# Patient Record
Sex: Female | Born: 1980
Health system: Southern US, Community
[De-identification: ages and names within clinical notes are randomized; demographics above are authoritative.]

## PROBLEM LIST (undated history)

## (undated) DIAGNOSIS — L0591 Pilonidal cyst without abscess: Secondary | ICD-10-CM

## (undated) DIAGNOSIS — R002 Palpitations: Secondary | ICD-10-CM

## (undated) DIAGNOSIS — R635 Abnormal weight gain: Secondary | ICD-10-CM

## (undated) DIAGNOSIS — R079 Chest pain, unspecified: Secondary | ICD-10-CM

## (undated) DIAGNOSIS — F419 Anxiety disorder, unspecified: Secondary | ICD-10-CM

## (undated) HISTORY — DX: Abnormal weight gain: R63.5

## (undated) HISTORY — DX: Anxiety disorder, unspecified: F41.9

## (undated) HISTORY — DX: Pilonidal cyst without abscess: L05.91

## (undated) HISTORY — PX: OTHER SURGICAL HISTORY: SHX169

## (undated) HISTORY — DX: Chest pain, unspecified: R07.9

## (undated) HISTORY — PX: WISDOM TOOTH EXTRACTION: SHX21

## (undated) HISTORY — DX: Palpitations: R00.2

---

## 2014-01-21 ENCOUNTER — Other Ambulatory Visit (HOSPITAL_COMMUNITY)
Admission: RE | Admit: 2014-01-21 | Discharge: 2014-01-21 | Disposition: A | Discharge status: Home / Self Care | Payer: BC Managed Care – PPO | Source: Ambulatory Visit | Attending: Family Medicine | Admitting: Family Medicine

## 2014-01-21 DIAGNOSIS — Z0000 Encounter for general adult medical examination without abnormal findings: Secondary | ICD-10-CM | POA: Insufficient documentation

## 2014-02-09 ENCOUNTER — Encounter: Payer: Self-pay | Admitting: *Deleted

## 2014-02-09 ENCOUNTER — Encounter: Payer: Self-pay | Admitting: Cardiology

## 2014-02-09 DIAGNOSIS — F419 Anxiety disorder, unspecified: Secondary | ICD-10-CM | POA: Insufficient documentation

## 2014-02-09 DIAGNOSIS — L0591 Pilonidal cyst without abscess: Secondary | ICD-10-CM | POA: Insufficient documentation

## 2014-02-09 DIAGNOSIS — R635 Abnormal weight gain: Secondary | ICD-10-CM | POA: Insufficient documentation

## 2014-02-09 DIAGNOSIS — R079 Chest pain, unspecified: Secondary | ICD-10-CM | POA: Insufficient documentation

## 2014-02-10 ENCOUNTER — Encounter: Payer: Self-pay | Admitting: Cardiovascular Disease

## 2014-02-10 ENCOUNTER — Ambulatory Visit (INDEPENDENT_AMBULATORY_CARE_PROVIDER_SITE_OTHER): Payer: BC Managed Care – PPO | Admitting: Cardiovascular Disease

## 2014-02-10 VITALS — BP 130/80 | HR 63 | Ht 70.0 in | Wt 274.0 lb

## 2014-02-10 DIAGNOSIS — F411 Generalized anxiety disorder: Secondary | ICD-10-CM

## 2014-02-10 DIAGNOSIS — R079 Chest pain, unspecified: Secondary | ICD-10-CM

## 2014-02-10 DIAGNOSIS — F17200 Nicotine dependence, unspecified, uncomplicated: Secondary | ICD-10-CM | POA: Insufficient documentation

## 2014-02-10 NOTE — Addendum Note (Signed)
Addended by: Scherrie BatemanYORK, Javari Bufkin E on: 02/10/2014 10:00 AM   Modules accepted: Orders

## 2014-02-10 NOTE — Assessment & Plan Note (Signed)
Consider changing celexa to welbutrin and PRN xanax to aid in smoking cessation

## 2014-02-10 NOTE — Assessment & Plan Note (Signed)
Ready to quit: No Counseling given: Yes CXR today

## 2014-02-10 NOTE — Progress Notes (Signed)
Patient ID: Tina SchilderKatie [name], female   DOB: 06/13/1981, 33 y.o.   MRN: [id number]   33 yo referred by primary [facility] for chest pain  Smoker for about 10 years  5 cigs/day.  No CXR ever done.  Panic attacks since young  SSCP sharp radiating to back usually in association with anxiety.  Has PRN xanax that helps.  Only one/two episodes/year now.  Sometimes gets pain when exercising but thinks this is muscular from overdoing it.  No pleuritic Component.  No cough sputum or fever  On SSRI for anxiety has never had cardiac w/u.  SSCP for years worse at beginning of this year.  History of mild dependant edema  Was on fluid pill at one time     ROS: Denies fever, malais, weight loss, blurry vision, decreased visual acuity, cough, sputum, SOB, hemoptysis, pleuritic pain, palpitaitons, heartburn, abdominal pain, melena, lower extremity edema, claudication, or rash.  All other systems reviewed and negative   General: Affect appropriate Healthy:  appears stated age HEENT: normal Neck supple with no adenopathy JVP normal no bruits no thyromegaly Lungs clear with no wheezing and good diaphragmatic motion Heart:  S1/S2 no murmur,rub, gallop or click PMI normal Abdomen: benighn, BS positve, no tenderness, no AAA no bruit.  No HSM or HJR Distal pulses intact with no bruits No edema Neuro non-focal Skin warm and dry No muscular weakness  Medications Current Outpatient Prescriptions  Medication Sig Dispense Refill  . citalopram (CELEXA) 20 MG tablet Take 1 tablet by mouth daily.      Marland Kitchen. LORazepam (ATIVAN) 1 MG tablet Take 1 tablet by mouth daily.      . Multiple Vitamin (MULTIVITAMIN) capsule Take 1 capsule by mouth daily.       No current facility-administered medications for this visit.    Allergies Review of patient's allergies indicates no known allergies.  Family History: No family history on file.  Social History: History   Social History  . Marital Status: N/A    Spouse Name: N/A      Number of Children: N/A  . Years of Education: N/A   Occupational History  . Not on file.   Social History Main Topics  . Smoking status: Current Every Day Smoker  . Smokeless tobacco: Not on file  . Alcohol Use: Yes  . Drug Use: Yes  . Sexual Activity: Not on file   Other Topics Concern  . Not on file   Social History Narrative  . No narrative on file    Electrocardiogram:  NSR rate 63 normal   Assessment and Plan

## 2014-02-10 NOTE — Patient Instructions (Signed)
Your physician recommends that you schedule a follow-up appointment in: AS NEEDED  Your physician recommends that you continue on your current medications as directed. Please refer to the Current Medication list given to you today. A chest x-ray takes a picture of the organs and structures inside the chest, including the heart, lungs, and blood vessels. This test can show several things, including, whether the heart is enlarges; whether fluid is building up in the lungs; and whether pacemaker / defibrillator leads are still in place. Your physician has requested that you have an exercise tolerance test. For further information please visit [email or web address]. Please also follow instruction sheet, as given.  

## 2014-02-10 NOTE — Assessment & Plan Note (Signed)
Atypical Normal ECG F/U ETT

## 2014-02-19 ENCOUNTER — Ambulatory Visit (HOSPITAL_COMMUNITY)
Admission: RE | Admit: 2014-02-19 | Discharge: 2014-02-19 | Disposition: A | Discharge status: Home / Self Care | Payer: BC Managed Care – PPO | Source: Ambulatory Visit | Attending: Internal Medicine | Admitting: Internal Medicine

## 2014-02-19 ENCOUNTER — Encounter: Payer: Self-pay | Admitting: Cardiovascular Disease

## 2014-02-19 DIAGNOSIS — R079 Chest pain, unspecified: Secondary | ICD-10-CM

## 2014-02-22 ENCOUNTER — Telehealth: Payer: Self-pay | Admitting: *Deleted

## 2014-02-22 NOTE — Telephone Encounter (Signed)
[name], [name] ','<More Detail >>       [name], MD       Sent: Mon February 22, 2014 9:44 AM    To: Alois Clichehristine E [name], LPN                   Message     Normal stress test        ----- Message -----    From: Mickel Duhamelobin C. Moffitt    Sent: 02/22/2014 8:51 AM    To: [name], MD                                  Results Exercise Tolerance Test (Order [id number])      Exercise Tolerance Test   Status: Final result Visible to patient: This result is not viewable by the patient. Next appt: None         Specimen Collected: 02/19/14 10:15 AM Last Resulted: 02/19/14 3:39 PM          Linked Documents     View Image               Result Information     Status     Final result (02/19/2014 3:39 PM)     Provider Status: Ordered    Encounter      View Encounter                Lab Information     M[facility]               Order-Level Documents:     There are no order-level documents.         Exercise Tolerance Test (Order [id number]) Released By: Auto-released Date: 02/19/2014  ECG Authorizing: Provider Default, MD Department: [facility] [address] [address]  Order: [id number]         Provider Default, MD  NPI:         Order Information     Order Date/Time Release Date/Time Start Date/Time End Date/Time    02/19/14 10:15 AM 02/19/14 10:15 AM 02/19/14 10:15 AM 02/19/14 10:15 AM           Order Details     Frequency Duration Priority Order Class    Once 1 occurrence Routine Normal           Comments     Ordered by Charlton HawsPETER NISHAN           Order History Inpatient     Date/Time Action Taken User Additional Information    02/19/14 1015 Release  From Order: [id number]    02/19/14 1539 Result Lab In [facility] Interface Final           Collection Information     Collected: 02/19/2014 10:15 AM Resulting Agency: [facility]          Patient Information     Patient Name  Sex DOB SSN    [name], Tina Female 01/18/1981 [id number]           Additional Information     Associated Reports    View Parent Encounter    Priority and Order Details    Collection Information.           Order Building surveyorrovider Info       Office phone Pager/beeper E-mail    Ordering User Lab In [facility] Interface -- -- --  Authorizing Provider Provider Default, MD [phone] -- --            Encounter     View Encounter            Order-Level Documents:     There are no order-level documents.         LMTCB .Zack Seal

## 2014-02-24 NOTE — Telephone Encounter (Signed)
PT AWARE OF STRESS TEST RESULTS./CY 

## 2014-12-16 ENCOUNTER — Other Ambulatory Visit: Payer: Self-pay | Admitting: Family Medicine

## 2014-12-16 ENCOUNTER — Ambulatory Visit
Admission: RE | Admit: 2014-12-16 | Discharge: 2014-12-16 | Disposition: A | Discharge status: Home / Self Care | Payer: BC Managed Care – PPO | Source: Ambulatory Visit | Attending: Family Medicine | Admitting: Family Medicine

## 2014-12-16 DIAGNOSIS — M25552 Pain in left hip: Secondary | ICD-10-CM

## 2015-06-20 ENCOUNTER — Other Ambulatory Visit: Payer: Self-pay | Admitting: Family Medicine

## 2015-06-20 ENCOUNTER — Other Ambulatory Visit (HOSPITAL_COMMUNITY)
Admission: RE | Admit: 2015-06-20 | Discharge: 2015-06-20 | Disposition: A | Discharge status: Home / Self Care | Payer: BLUE CROSS/BLUE SHIELD | Source: Ambulatory Visit | Attending: Family Medicine | Admitting: Family Medicine

## 2015-06-20 DIAGNOSIS — Z01419 Encounter for gynecological examination (general) (routine) without abnormal findings: Secondary | ICD-10-CM | POA: Insufficient documentation

## 2015-06-22 LAB — CYTOLOGY - PAP

## 2015-08-03 ENCOUNTER — Other Ambulatory Visit: Payer: Self-pay | Admitting: Surgery

## 2015-08-03 NOTE — H&P (Signed)
Tina [name] 08/03/2015 9:17 AM Location: [facility] Patient #: [id number] DOB: 26-May-1981 Single / Language: [place] / Race: White Female History of Present Illness Ardeth Sportsman MD; 08/03/2015 12:32 PM) Patient words: anal fissure.  The patient is a 34 year old female who presents with anal fistula. Patient sent by primary care physician Dr. Fulton Mole for consultation concerning perianal drainage and probable perirectal fistula Pleasant morbidly obese female. History of pilonidal abscesses that required incision and drainage at least 3 times. Twice in college. Usually every month she'll get some swelling and mild drainage but nothing severe. Does not feel like the areas ever fully closed. The main reason she is here because she had some sharp perirectal pain and swelling. Noticed a lump. Wondered was a hemorrhoid. Tried creams and wipes. It became or fluctuance. Then started to drain. Her partner help express to drainage. Suspicion for anal fistula on examination by her primary care physician. Surgical consultation requested. She does smoke a few cigarettes a day. Usually has to bowel was a day. Denies any severe episodes of constipation or diarrhea. Denies any inflammatory bowel disease, irritable bowel syndrome, food or dietary allergies. We will continue miles without difficulty. 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 No severe distress. No hematochezia or melena. She's never had prior anorectal interventions. No history of fall or trauma. She does note that she tends to get lumps insists on her body. HAd one in her groin. No definite boils/abscesses aside from the pilonidal and perirectal areas. Other Problems Lamar Laundry Bynum, CMA; 08/03/2015 9:17 AM) Anxiety Disorder Gastroesophageal Reflux Disease High blood pressure  Past Surgical History Gilmer Mor, CMA; 08/03/2015 9:17 AM) Tonsillectomy  Diagnostic  Studies History Gilmer Mor, CMA; 08/03/2015 9:17 AM) Colonoscopy never Mammogram never Pap Smear 1-5 years ago  Allergies Lamar Laundry Bynum, CMA; 08/03/2015 9:18 AM) No Known Drug Allergies 08/03/2015  Medication History (Sonya Bynum, CMA; 08/03/2015 9:19 AM) LORazepam (1MG  Tablet, Oral) Active. Citalopram Hydrobromide (40MG  Tablet, Oral) Active. Multivitamin Adult (Oral) Active. Medications Reconciled  Social History Gilmer Mor, CMA; 08/03/2015 9:17 AM) Alcohol use Moderate alcohol use. Caffeine use Carbonated beverages, Coffee. No drug use Tobacco use Current some day smoker.  Family History Gilmer Mor, CMA; 08/03/2015 9:17 AM) Breast Cancer Family Members In General. Cancer Family Members In General. Cerebrovascular Accident Family Members In General. Depression Father. Hypertension Family Members In General, Father.  Pregnancy / Birth History Gilmer Mor, CMA; 08/03/2015 9:17 AM) Age at menarche 13 years. Gravida 0 Para 0 Regular periods     Review of Systems Lamar Laundry Bynum CMA; 08/03/2015 9:17 AM) General Not Present- Appetite Loss, Chills, Fatigue, Fever, Night Sweats, Weight Gain and Weight Loss. Skin Not Present- Change in Wart/Mole, Dryness, Hives, Jaundice, New Lesions, Non-Healing Wounds, Rash and Ulcer. HEENT Not Present- Earache, Hearing Loss, Hoarseness, Nose Bleed, Oral Ulcers, Ringing in the Ears, Seasonal Allergies, Sinus Pain, Sore Throat, Visual Disturbances, Wears glasses/contact lenses and Yellow Eyes. Respiratory Present- Snoring. Not Present- Bloody sputum, Chronic Cough, Difficulty Breathing and Wheezing. Breast Not Present- Breast Mass, Breast Pain, Nipple Discharge and Skin Changes. Cardiovascular Not Present- Chest Pain, Difficulty Breathing Lying Down, Leg Cramps, Palpitations, Rapid Heart Rate, Shortness of Breath and Swelling of Extremities. Gastrointestinal Present- Rectal Pain. Not Present- Abdominal Pain, Bloating, Bloody Stool,  Change in Bowel Habits, Chronic diarrhea, Constipation, Difficulty Swallowing, Excessive gas, Gets full quickly at meals, Hemorrhoids, Indigestion, Nausea and Vomiting. Female Genitourinary Not Present- Frequency, Nocturia, Painful  Urination, Pelvic Pain and Urgency. Musculoskeletal Not Present- Back Pain, Joint Pain, Joint Stiffness, Muscle Pain, Muscle Weakness and Swelling of Extremities. Neurological Not Present- Decreased Memory, Fainting, Headaches, Numbness, Seizures, Tingling, Tremor, Trouble walking and Weakness. Psychiatric Present- Anxiety. Not Present- Bipolar, Change in Sleep Pattern, Depression, Fearful and Frequent crying. Endocrine Not Present- Cold Intolerance, Excessive Hunger, Hair Changes, Heat Intolerance, Hot flashes and New Diabetes. Hematology Not Present- Easy Bruising, Excessive bleeding, Gland problems, HIV and Persistent Infections.  Vitals (Sonya Bynum CMA; 08/03/2015 9:18 AM) 08/03/2015 9:17 AM Weight: 277.2 lb Height: 70in Body Surface Area: 2.49 m Body Mass Index: 39.77 kg/m Temp.: 98.38F(Temporal)  Pulse: 76 (Regular)  BP: 128/80 (Sitting, Left Arm, Standard)     Physical Exam Ardeth Sportsman MD; 08/03/2015 10:09 AM)  General Mental Status-Alert. General Appearance-Not in acute distress, Not Sickly. Orientation-Oriented X3. Hydration-Well hydrated. Voice-Normal.  Integumentary Global Assessment Upon inspection and palpation of skin surfaces of the - Axillae: non-tender, no inflammation or ulceration, no drainage. and Distribution of scalp and body hair is normal. General Characteristics Temperature - normal warmth is noted.  Head and Neck Head-normocephalic, atraumatic with no lesions or palpable masses. Face Global Assessment - atraumatic, no absence of expression. Neck Global Assessment - no abnormal movements, no bruit auscultated on the right, no bruit auscultated on the left, no decreased range of motion,  non-tender. Trachea-midline. Thyroid Gland Characteristics - non-tender.  Eye Eyeball - Left-Extraocular movements intact, No Nystagmus. Eyeball - Right-Extraocular movements intact, No Nystagmus. Cornea - Left-No Hazy. Cornea - Right-No Hazy. Sclera/Conjunctiva - Left-No scleral icterus, No Discharge. Sclera/Conjunctiva - Right-No scleral icterus, No Discharge. Pupil - Left-Direct reaction to light normal. Pupil - Right-Direct reaction to light normal.  ENMT Ears Pinna - Left - no drainage observed, no generalized tenderness observed. Right - no drainage observed, no generalized tenderness observed. Nose and Sinuses External Inspection of the Nose - no destructive lesion observed. Inspection of the nares - Left - quiet respiration. Right - quiet respiration. Mouth and Throat Lips - Upper Lip - no fissures observed, no pallor noted. Lower Lip - no fissures observed, no pallor noted. Nasopharynx - no discharge present. Oral Cavity/Oropharynx - Tongue - no dryness observed. Oral Mucosa - no cyanosis observed. Hypopharynx - no evidence of airway distress observed.  Chest and Lung Exam Inspection Movements - Normal and Symmetrical. Accessory muscles - No use of accessory muscles in breathing. Palpation Palpation of the chest reveals - Non-tender. Auscultation Breath sounds - Normal and Clear.  Cardiovascular Auscultation Rhythm - Regular. Murmurs & Other Heart Sounds - Auscultation of the heart reveals - No Murmurs and No Systolic Clicks.  Abdomen Inspection Inspection of the abdomen reveals - No Visible peristalsis and No Abnormal pulsations. Umbilicus - No Bleeding, No Urine drainage. Palpation/Percussion Palpation and Percussion of the abdomen reveal - Soft, Non Tender, No Rebound tenderness, No Rigidity (guarding) and No Cutaneous hyperesthesia. Note: Obese but soft. No diastases. No umbilical hernia.   Female Genitourinary Sexual Maturity Tanner 5 -  Adult hair pattern. Note: No vaginal bleeding nor discharge. Rectovaginal septum normal size without any fluctuance or mass.   Rectal Note: Perianal skin clear. No sinus. No abscess. Normal sphincter tone. No fistula. No external hemorrhoids. No fissure. Tolerates digital and anoscopic examination. Small dot of yellow drainage in LEFT anterior anal crypt but again no reproduction of drainage with perianal or vaginal pressure.  Scar RIGHT paramedian upper intergluteal cleft consistent with prior pilonidal incision and drainage. 2 pits. Midline. No pus  expressed today. Mild tenderness   Peripheral Vascular Upper Extremity Inspection - Left - No Cyanotic nailbeds, Not Ischemic. Right - No Cyanotic nailbeds, Not Ischemic.  Neurologic Neurologic evaluation reveals -normal attention span and ability to concentrate, able to name objects and repeat phrases. Appropriate fund of knowledge , normal sensation and normal coordination. Mental Status Affect - not angry, not paranoid. Cranial Nerves-Normal Bilaterally. Gait-Normal.  Neuropsychiatric Mental status exam performed with findings of-able to articulate well with normal speech/language, rate, volume and coherence, thought content normal with ability to perform basic computations and apply abstract reasoning and no evidence of hallucinations, delusions, obsessions or homicidal/suicidal ideation.  Musculoskeletal Global Assessment Spine, Ribs and Pelvis - no instability, subluxation or laxity. Right Upper Extremity - no instability, subluxation or laxity.  Lymphatic Head & Neck  General Head & Neck Lymphatics: Bilateral - Description - No Localized lymphadenopathy. Axillary  General Axillary Region: Bilateral - Description - No Localized lymphadenopathy. Femoral & Inguinal  Generalized Femoral & Inguinal Lymphatics: Left - Description - No Localized lymphadenopathy. Right - Description - No Localized  lymphadenopathy.    Assessment & Plan Ardeth Sportsman MD; 08/03/2015 12:33 PM)  PILONIDAL DISEASE (709.8  L98.8) Impression: Persistent scar with intermittent inflammation and drainage on a monthly basis.  Think she would benefit from removal of the pilonidal disease since it is a recurrent problem. Interesting seems to be related to her menstrual periods - ?hormonally triggered.  Because it been a problem for years, she wishes to try and coordinate a time that is more convenient from a work standpoint.  I strongly recommend she quit smoking. She agrees that would help break the cycle of infections.  Current Plans Schedule for Surgery Written instructions provided The anatomy of the intragluteal cleft was discussed. Pathophysiology of pilonidal disease was discussed. The importance of keeping hairs trimmed to avoid recurrence was discussed. Discussion of options such as curretage, excision with closure vs leaving open was discussed. Risks of infection with need for incision and drainage & antibiotics were discussed. I noted a good likelihood this will help address the problem.  At this point, I think the patient would best served with considering surgery to excise the diseased tissue. I will make an attempt to close but it may need to be left open to allow it to heal with secondary intention and wound packing. Possible recurrences need reoperation or different techniques were discussed as well. I noted that recurrence is higher with poor compliance on hair removal hygiene & overall health. The patient's questions were answered. The patient agrees to proceed. Pt Education - [facility] Pilonidal Disease (AT) Pt Education - [facility] Good Bowel Health (Xane Ams[place]) STOP SMOKING!  We strongly recommend that you stop smoking. Smoking increases the risk of surgery including infection in the form of an open wound, pus formation, abscess, hernia at an incision on the abdomen, etc. You have an increased risk of other  MAJOR complications such as stroke, heart attack, forming clots in the leg and/or lungs, and death.  Smoking Cessation Quitting smoking is important to your health and has many advantages. However, it is not always easy to quit since nicotine is a very addictive drug. Often times, people try 3 times or more before being able to quit. This document explains the best ways for you to prepare to quit smoking. Quitting takes hard work and a lot of effort, but you can do it. ADVANTAGES OF QUITTING SMOKING  You will live longer, feel better, and live better.  Your body will feel the impact of quitting smoking almost immediately.  Within 20 minutes, blood pressure decreases. Your pulse returns to its normal level.  After 8 hours, carbon monoxide levels in the blood return to normal. Your oxygen level increases.  After 24 hours, the chance of having a heart attack starts to decrease. Your breath, hair, and body stop smelling like smoke.  After 48 hours, damaged nerve endings begin to recover. Your sense of taste and smell improve.  After 72 hours, the body is virtually free of nicotine. Your bronchial tubes relax and breathing becomes easier.  After 2 to 12 weeks, lungs can hold more air. Exercise becomes easier and circulation improves.  The risk of having a heart attack, stroke, cancer, or lung disease is greatly reduced.  After 1 year, the risk of coronary heart disease is cut in half.  After 5 years, the risk of stroke falls to the same as a nonsmoker.  After 10 years, the risk of lung cancer is cut in half and the risk of other cancers decreases significantly.  After 15 years, the risk of coronary heart disease drops, usually to the level of a nonsmoker.  If you are pregnant, quitting s PERI-RECTAL ABSCESS (566  K61.1) Impression: History of prior exam suspicious for perirectal abscess.  There is no evidence of any external coronary persistent abscess at this time. I believe it is  healed. No strong evidence of fistula at this time.  Small dot of drainage and an anal crypt but again no drainage externally. I think she is healed on her own.  Should she get another infection, would treat with incision and drainage and antibiotics. If has persistent drainage, then it and examination under anesthesia to rule out fistula. Does not seem to likely at this point, especially if she quit smoking.  Current Plans Pt Education - [facility] Abscess/Fistula (AT)  Ardeth Sportsman, M.D., F.A.C.S. Gastrointestinal and Minimally Invasive Surgery [facility] [address], [address] [place], [place] [address] [phone] Main / Paging

## 2016-04-25 DIAGNOSIS — F411 Generalized anxiety disorder: Secondary | ICD-10-CM | POA: Diagnosis not present

## 2016-06-05 DIAGNOSIS — F411 Generalized anxiety disorder: Secondary | ICD-10-CM | POA: Diagnosis not present

## 2016-06-20 ENCOUNTER — Other Ambulatory Visit: Payer: Self-pay | Admitting: Family Medicine

## 2016-06-20 ENCOUNTER — Other Ambulatory Visit (HOSPITAL_COMMUNITY)
Admission: RE | Admit: 2016-06-20 | Discharge: 2016-06-20 | Disposition: A | Discharge status: Home / Self Care | Payer: BLUE CROSS/BLUE SHIELD | Source: Ambulatory Visit | Attending: Family Medicine | Admitting: Family Medicine

## 2016-06-20 DIAGNOSIS — E785 Hyperlipidemia, unspecified: Secondary | ICD-10-CM | POA: Diagnosis not present

## 2016-06-20 DIAGNOSIS — R600 Localized edema: Secondary | ICD-10-CM | POA: Diagnosis not present

## 2016-06-20 DIAGNOSIS — E6609 Other obesity due to excess calories: Secondary | ICD-10-CM | POA: Diagnosis not present

## 2016-06-20 DIAGNOSIS — Z01419 Encounter for gynecological examination (general) (routine) without abnormal findings: Secondary | ICD-10-CM | POA: Insufficient documentation

## 2016-06-20 DIAGNOSIS — G479 Sleep disorder, unspecified: Secondary | ICD-10-CM | POA: Diagnosis not present

## 2016-06-20 DIAGNOSIS — Z0000 Encounter for general adult medical examination without abnormal findings: Secondary | ICD-10-CM | POA: Diagnosis not present

## 2016-06-21 LAB — CYTOLOGY - PAP

## 2016-07-02 DIAGNOSIS — F411 Generalized anxiety disorder: Secondary | ICD-10-CM | POA: Diagnosis not present

## 2016-08-13 DIAGNOSIS — F411 Generalized anxiety disorder: Secondary | ICD-10-CM | POA: Diagnosis not present

## 2016-11-13 DIAGNOSIS — F411 Generalized anxiety disorder: Secondary | ICD-10-CM | POA: Diagnosis not present

## 2016-12-17 DIAGNOSIS — R110 Nausea: Secondary | ICD-10-CM | POA: Diagnosis not present

## 2016-12-17 DIAGNOSIS — M549 Dorsalgia, unspecified: Secondary | ICD-10-CM | POA: Diagnosis not present

## 2017-01-11 DIAGNOSIS — J069 Acute upper respiratory infection, unspecified: Secondary | ICD-10-CM | POA: Diagnosis not present

## 2017-03-13 DIAGNOSIS — R002 Palpitations: Secondary | ICD-10-CM | POA: Diagnosis not present

## 2017-03-14 ENCOUNTER — Telehealth: Payer: Self-pay

## 2017-03-14 NOTE — Telephone Encounter (Signed)
SENT NOTES TO SCHEDULING 

## 2017-03-15 DIAGNOSIS — R002 Palpitations: Secondary | ICD-10-CM | POA: Insufficient documentation

## 2017-03-18 ENCOUNTER — Ambulatory Visit (INDEPENDENT_AMBULATORY_CARE_PROVIDER_SITE_OTHER): Payer: BLUE CROSS/BLUE SHIELD | Admitting: Cardiovascular Disease

## 2017-03-18 ENCOUNTER — Encounter (INDEPENDENT_AMBULATORY_CARE_PROVIDER_SITE_OTHER): Payer: Self-pay

## 2017-03-18 ENCOUNTER — Ambulatory Visit: Payer: Self-pay | Admitting: Cardiovascular Disease

## 2017-03-18 VITALS — BP 138/86 | HR 83 | Ht 70.0 in | Wt 269.8 lb

## 2017-03-18 DIAGNOSIS — R002 Palpitations: Secondary | ICD-10-CM

## 2017-03-18 MED ORDER — PROPRANOLOL HCL 10 MG PO TABS: 10.0000 mg | ORAL_TABLET | ORAL | 11 refills | Status: DC | PRN

## 2017-03-18 NOTE — Progress Notes (Signed)
Cardiology Office Note   Date:  03/18/2017   ID:  Tina SchilderKatie Strack, DOB 03/26/1981, MRN [id number]  PCP:  Leanor RubensteinSUN,VYVYAN Y, MD  Cardiologist:   Charlton HawsPeter Nishan, MD   Chief Complaint  Patient presents with  . New Patient (Initial Visit)      History of Present Illness: Tina [name] is a 36 y.o. female who presents for evaluation of palpitations and family history of CAD.  Seen by [facility] 02/28/17 Notes reviewed. Complained of heart racing and chest tightness. 03/10/17 woke up HR 90-110 according to apple watch.  Took ativan and did not help as it usually dose with panic type symptoms.  Took another 1/2 tab and fell asleep.  Trouble taking deep breath from anxiety.  Did have more ETOH than usual night before celebrating with friend. Unhealthy life style and smokes a pack / week   ECG done in office reviewed and normal Labs reviewed.  Hct 41.1 TSH 1.7 CR .9 K 4.1 Normal LFT;s LDL 147 TC 210 HDL 41   She has chronic anxiety / depression has seen counselor in past Works at [employer].   Smokes and drinks on occasion and that tends to cause palpitations  Dad had MI in 7660's and also had mental health issues.  Has two dogs at home and significant other is female   Past Medical History:  Diagnosis Date  . Anxiety   . Chest pain   . Palpitations   . Pilonidal cyst   . Weight gain     Past Surgical History:  Procedure Laterality Date  . pilonidal cyst 2009    . WISDOM TOOTH EXTRACTION       Current Outpatient Prescriptions  Medication Sig Dispense Refill  . buPROPion (WELLBUTRIN XL) 150 MG 24 hr tablet Take 150 mg by mouth every morning.  8  . LORazepam (ATIVAN) 1 MG tablet Take 1 tablet by mouth daily as needed for anxiety.     . propranolol (INDERAL) 10 MG tablet Take 1 tablet (10 mg total) by mouth as needed (palpitation). 30 tablet 11   No current facility-administered medications for this visit.     Allergies:   Patient has no known allergies.     Social History:  The patient  reports that she has been smoking.  She does not have any smokeless tobacco history on file. She reports that she drinks alcohol. She reports that she uses drugs.   Family History:  The patient's family history includes CVA in her paternal grandmother; Cancer - Lung in her maternal grandmother; Cholesteatoma in her father; Heart attack in her maternal grandfather; Heart attack (age of onset: 6758) in her father; Hyperlipidemia in her father; Hypertension in her father, mother, and paternal grandmother.    ROS:  Please see the history of present illness.   Otherwise, review of systems are positive for none.   All other systems are reviewed and negative.    PHYSICAL EXAM: VS:  BP 138/86   Pulse 83   Ht 5\' 10"  (1.778 m)   Wt 269 lb 12.8 oz (122.4 kg)   BMI 38.71 kg/m  , BMI Body mass index is 38.71 kg/m. Affect appropriate Overweight white female  HEENT: normal Neck supple with no adenopathy JVP normal no bruits no thyromegaly Lungs clear with no wheezing and good diaphragmatic motion Heart:  S1/S2 no murmur, no rub, gallop or click PMI normal Abdomen: benighn, BS positve, no tenderness, no AAA no bruit.  No HSM or HJR Distal  pulses intact with no bruits No edema Neuro non-focal Skin warm and dry No muscular weakness    EKG:  SR rate 67 normal ECG QT 400 03/13/17     Recent Labs: No results found for requested labs within last 8760 hours.    Lipid Panel No results found for: CHOL, TRIG, HDL, CHOLHDL, VLDL, LDLCALC, LDLDIRECT    Wt Readings from Last 3 Encounters:  03/18/17 269 lb 12.8 oz (122.4 kg)  02/10/14 274 lb (124.3 kg)      Other studies Reviewed: Additional studies/ records that were reviewed today include: Notes from primary lab work and ECG .    ASSESSMENT AND PLAN:  1. Palpitations benign not frequent enough for monitor PRN inderal 2. Anxiety PRN ativan encouraged f/u with counselor 3. HTN: weight loss and low sodium  intake with exercise.  monitor 4. Elevated lipids with family history of CAD will get calcium score if higher than average may consider Rx 5. Obesity discussed low carb diet   Current medicines are reviewed at length with the patient today.  The patient does not have concerns regarding medicines.  The following changes have been made:  PRN Inderal   Labs/ tests ordered today include: Calcium Score   Orders Placed This Encounter  Procedures  . CT CARDIAC SCORING     Disposition:   FU with me PRN      Signed, Charlton Haws, MD  03/18/2017 5:12 PM    [facility] [address] [place], [place], [place]  [address] Phone: [phone]; Fax: [phone]

## 2017-03-18 NOTE — Patient Instructions (Addendum)
Medication Instructions:  Your physician has recommended you make the following change in your medication:  1-Inderal 10 mg by mouth daily as needed for palpitations.   Labwork: NONE  Testing/Procedure: Cardiac CT scanning Calcium Score, (CAT scanning), is a noninvasive, special x-ray that produces cross-sectional images of the body using x-rays and a computer. CT scans help physicians diagnose and treat medical conditions. For some CT exams, a contrast material is used to enhance visibility in the area of the body being studied. CT scans provide greater clarity and reveal more details than regular x-ray exams.  Follow-Up: Your physician wants you to follow-up as needed with Dr. Eden EmmsNishan.   If you need a refill on your cardiac medications before your next appointment, please call your pharmacy.

## 2017-04-30 DIAGNOSIS — F330 Major depressive disorder, recurrent, mild: Secondary | ICD-10-CM | POA: Diagnosis not present

## 2017-06-25 ENCOUNTER — Other Ambulatory Visit: Payer: Self-pay | Admitting: Family Medicine

## 2017-06-25 ENCOUNTER — Other Ambulatory Visit (HOSPITAL_COMMUNITY)
Admission: RE | Admit: 2017-06-25 | Discharge: 2017-06-25 | Disposition: A | Discharge status: Home / Self Care | Payer: BLUE CROSS/BLUE SHIELD | Source: Ambulatory Visit | Attending: Family Medicine | Admitting: Family Medicine

## 2017-06-25 DIAGNOSIS — L299 Pruritus, unspecified: Secondary | ICD-10-CM | POA: Diagnosis not present

## 2017-06-25 DIAGNOSIS — E785 Hyperlipidemia, unspecified: Secondary | ICD-10-CM | POA: Diagnosis not present

## 2017-06-25 DIAGNOSIS — Z01419 Encounter for gynecological examination (general) (routine) without abnormal findings: Secondary | ICD-10-CM | POA: Diagnosis not present

## 2017-06-25 DIAGNOSIS — Z0000 Encounter for general adult medical examination without abnormal findings: Secondary | ICD-10-CM | POA: Diagnosis not present

## 2017-06-25 DIAGNOSIS — Z124 Encounter for screening for malignant neoplasm of cervix: Secondary | ICD-10-CM | POA: Diagnosis not present

## 2017-06-26 LAB — CYTOLOGY - PAP: DIAGNOSIS: NEGATIVE

## 2017-09-18 DIAGNOSIS — F330 Major depressive disorder, recurrent, mild: Secondary | ICD-10-CM | POA: Diagnosis not present

## 2017-10-15 DIAGNOSIS — F330 Major depressive disorder, recurrent, mild: Secondary | ICD-10-CM | POA: Diagnosis not present

## 2017-11-25 DIAGNOSIS — F410 Panic disorder [episodic paroxysmal anxiety] without agoraphobia: Secondary | ICD-10-CM | POA: Diagnosis not present

## 2017-11-25 DIAGNOSIS — F422 Mixed obsessional thoughts and acts: Secondary | ICD-10-CM | POA: Diagnosis not present

## 2017-12-16 ENCOUNTER — Telehealth: Payer: Self-pay

## 2017-12-16 DIAGNOSIS — R002 Palpitations: Secondary | ICD-10-CM

## 2017-12-16 NOTE — Telephone Encounter (Signed)
Put order in for patient to have CT Calcium Score.

## 2017-12-16 NOTE — Telephone Encounter (Signed)
-----   Message from Ray[place] CitizenStacey J Snead, [place] sent at 12/16/2017  9:04 AM EST ----- Regarding: CT CA Score Tina   Jaxie [name] called today to schedule her CT CA Score. Her order has expired can you put in a new order for a CT CA Score? Pt of Dr. Eden EmmsNishan.    Thanks Visteon CorporationStacey

## 2017-12-25 ENCOUNTER — Other Ambulatory Visit: Payer: Self-pay

## 2017-12-25 ENCOUNTER — Ambulatory Visit (INDEPENDENT_AMBULATORY_CARE_PROVIDER_SITE_OTHER)
Admission: RE | Admit: 2017-12-25 | Discharge: 2017-12-25 | Disposition: A | Discharge status: Home / Self Care | Payer: Self-pay | Source: Ambulatory Visit | Attending: Cardiovascular Disease | Admitting: Cardiovascular Disease

## 2017-12-25 DIAGNOSIS — R002 Palpitations: Secondary | ICD-10-CM

## 2018-01-14 ENCOUNTER — Encounter: Payer: Self-pay | Admitting: Cardiovascular Disease

## 2018-01-29 ENCOUNTER — Ambulatory Visit: Payer: Self-pay | Admitting: Cardiovascular Disease

## 2018-02-24 DIAGNOSIS — F410 Panic disorder [episodic paroxysmal anxiety] without agoraphobia: Secondary | ICD-10-CM | POA: Diagnosis not present

## 2018-03-19 DIAGNOSIS — J0100 Acute maxillary sinusitis, unspecified: Secondary | ICD-10-CM | POA: Diagnosis not present

## 2018-05-14 DIAGNOSIS — R0981 Nasal congestion: Secondary | ICD-10-CM | POA: Diagnosis not present

## 2018-08-18 DIAGNOSIS — F411 Generalized anxiety disorder: Secondary | ICD-10-CM | POA: Diagnosis not present

## 2018-08-18 DIAGNOSIS — F410 Panic disorder [episodic paroxysmal anxiety] without agoraphobia: Secondary | ICD-10-CM | POA: Diagnosis not present

## 2018-10-03 DIAGNOSIS — M25551 Pain in right hip: Secondary | ICD-10-CM | POA: Diagnosis not present

## 2018-10-15 DIAGNOSIS — J0100 Acute maxillary sinusitis, unspecified: Secondary | ICD-10-CM | POA: Diagnosis not present

## 2018-10-24 DIAGNOSIS — M25551 Pain in right hip: Secondary | ICD-10-CM | POA: Diagnosis not present

## 2018-10-30 DIAGNOSIS — M24151 Other articular cartilage disorders, right hip: Secondary | ICD-10-CM | POA: Diagnosis not present

## 2018-11-26 DIAGNOSIS — Z13220 Encounter for screening for lipoid disorders: Secondary | ICD-10-CM | POA: Diagnosis not present

## 2018-11-26 DIAGNOSIS — I10 Essential (primary) hypertension: Secondary | ICD-10-CM | POA: Diagnosis not present

## 2018-11-26 DIAGNOSIS — Z0000 Encounter for general adult medical examination without abnormal findings: Secondary | ICD-10-CM | POA: Diagnosis not present

## 2018-11-26 DIAGNOSIS — M169 Osteoarthritis of hip, unspecified: Secondary | ICD-10-CM | POA: Diagnosis not present

## 2018-11-26 DIAGNOSIS — E6609 Other obesity due to excess calories: Secondary | ICD-10-CM | POA: Diagnosis not present

## 2018-11-26 DIAGNOSIS — N83201 Unspecified ovarian cyst, right side: Secondary | ICD-10-CM | POA: Diagnosis not present

## 2018-12-31 DIAGNOSIS — J189 Pneumonia, unspecified organism: Secondary | ICD-10-CM | POA: Diagnosis not present

## 2019-02-23 DIAGNOSIS — F410 Panic disorder [episodic paroxysmal anxiety] without agoraphobia: Secondary | ICD-10-CM | POA: Diagnosis not present

## 2019-02-23 DIAGNOSIS — F422 Mixed obsessional thoughts and acts: Secondary | ICD-10-CM | POA: Diagnosis not present

## 2019-04-22 DIAGNOSIS — F17210 Nicotine dependence, cigarettes, uncomplicated: Secondary | ICD-10-CM | POA: Diagnosis not present

## 2019-04-22 DIAGNOSIS — I809 Phlebitis and thrombophlebitis of unspecified site: Secondary | ICD-10-CM | POA: Diagnosis not present

## 2019-04-22 DIAGNOSIS — I8390 Asymptomatic varicose veins of unspecified lower extremity: Secondary | ICD-10-CM | POA: Diagnosis not present

## 2019-08-18 DIAGNOSIS — F422 Mixed obsessional thoughts and acts: Secondary | ICD-10-CM | POA: Diagnosis not present

## 2019-08-18 DIAGNOSIS — F410 Panic disorder [episodic paroxysmal anxiety] without agoraphobia: Secondary | ICD-10-CM | POA: Diagnosis not present

## 2019-10-03 DIAGNOSIS — Z20828 Contact with and (suspected) exposure to other viral communicable diseases: Secondary | ICD-10-CM | POA: Diagnosis not present

## 2019-10-25 DIAGNOSIS — J029 Acute pharyngitis, unspecified: Secondary | ICD-10-CM | POA: Diagnosis not present

## 2019-10-25 DIAGNOSIS — Z20828 Contact with and (suspected) exposure to other viral communicable diseases: Secondary | ICD-10-CM | POA: Diagnosis not present

## 2020-01-02 DIAGNOSIS — Z20828 Contact with and (suspected) exposure to other viral communicable diseases: Secondary | ICD-10-CM | POA: Diagnosis not present

## 2020-02-15 DIAGNOSIS — Z1159 Encounter for screening for other viral diseases: Secondary | ICD-10-CM | POA: Diagnosis not present

## 2020-02-15 DIAGNOSIS — Z23 Encounter for immunization: Secondary | ICD-10-CM | POA: Diagnosis not present

## 2020-02-15 DIAGNOSIS — Z113 Encounter for screening for infections with a predominantly sexual mode of transmission: Secondary | ICD-10-CM | POA: Diagnosis not present

## 2020-02-15 DIAGNOSIS — E7800 Pure hypercholesterolemia, unspecified: Secondary | ICD-10-CM | POA: Diagnosis not present

## 2020-02-15 DIAGNOSIS — Z0000 Encounter for general adult medical examination without abnormal findings: Secondary | ICD-10-CM | POA: Diagnosis not present

## 2020-02-20 DIAGNOSIS — F411 Generalized anxiety disorder: Secondary | ICD-10-CM | POA: Diagnosis not present

## 2020-02-20 DIAGNOSIS — F422 Mixed obsessional thoughts and acts: Secondary | ICD-10-CM | POA: Diagnosis not present

## 2020-02-23 DIAGNOSIS — N83201 Unspecified ovarian cyst, right side: Secondary | ICD-10-CM | POA: Diagnosis not present

## 2020-02-24 ENCOUNTER — Other Ambulatory Visit: Payer: Self-pay | Admitting: Family Medicine

## 2020-02-24 DIAGNOSIS — N838 Other noninflammatory disorders of ovary, fallopian tube and broad ligament: Secondary | ICD-10-CM

## 2020-02-26 ENCOUNTER — Other Ambulatory Visit: Payer: Self-pay

## 2020-02-26 ENCOUNTER — Ambulatory Visit
Admission: RE | Admit: 2020-02-26 | Discharge: 2020-02-26 | Disposition: A | Discharge status: Home / Self Care | Payer: BC Managed Care – PPO | Source: Ambulatory Visit | Attending: Family Medicine | Admitting: Family Medicine

## 2020-02-26 DIAGNOSIS — N838 Other noninflammatory disorders of ovary, fallopian tube and broad ligament: Secondary | ICD-10-CM

## 2020-02-29 ENCOUNTER — Other Ambulatory Visit: Payer: Self-pay | Admitting: Family Medicine

## 2020-02-29 DIAGNOSIS — N838 Other noninflammatory disorders of ovary, fallopian tube and broad ligament: Secondary | ICD-10-CM

## 2020-03-04 DIAGNOSIS — Z23 Encounter for immunization: Secondary | ICD-10-CM | POA: Diagnosis not present

## 2020-03-09 DIAGNOSIS — Z124 Encounter for screening for malignant neoplasm of cervix: Secondary | ICD-10-CM | POA: Diagnosis not present

## 2020-03-09 DIAGNOSIS — N83201 Unspecified ovarian cyst, right side: Secondary | ICD-10-CM | POA: Diagnosis not present

## 2020-03-09 DIAGNOSIS — N83202 Unspecified ovarian cyst, left side: Secondary | ICD-10-CM | POA: Diagnosis not present

## 2020-03-09 DIAGNOSIS — N809 Endometriosis, unspecified: Secondary | ICD-10-CM | POA: Diagnosis not present

## 2020-03-13 ENCOUNTER — Other Ambulatory Visit: Payer: Self-pay

## 2020-03-13 ENCOUNTER — Ambulatory Visit
Admission: RE | Admit: 2020-03-13 | Discharge: 2020-03-13 | Disposition: A | Discharge status: Home / Self Care | Payer: BC Managed Care – PPO | Source: Ambulatory Visit | Attending: Family Medicine | Admitting: Family Medicine

## 2020-03-13 DIAGNOSIS — N838 Other noninflammatory disorders of ovary, fallopian tube and broad ligament: Secondary | ICD-10-CM

## 2020-03-13 DIAGNOSIS — N800 Endometriosis of uterus: Secondary | ICD-10-CM | POA: Diagnosis not present

## 2020-03-13 MED ORDER — GADOBENATE DIMEGLUMINE 529 MG/ML IV SOLN
20.0000 mL | Freq: Once | INTRAVENOUS | Status: AC | PRN
  Administered 2020-03-13: 20 mL via INTRAVENOUS

## 2020-03-26 ENCOUNTER — Other Ambulatory Visit: Payer: BC Managed Care – PPO

## 2020-09-07 DIAGNOSIS — Z20822 Contact with and (suspected) exposure to covid-19: Secondary | ICD-10-CM | POA: Diagnosis not present

## 2020-09-13 DIAGNOSIS — F422 Mixed obsessional thoughts and acts: Secondary | ICD-10-CM | POA: Diagnosis not present

## 2020-09-13 DIAGNOSIS — F410 Panic disorder [episodic paroxysmal anxiety] without agoraphobia: Secondary | ICD-10-CM | POA: Diagnosis not present

## 2020-09-15 DIAGNOSIS — M79604 Pain in right leg: Secondary | ICD-10-CM | POA: Diagnosis not present

## 2020-09-15 DIAGNOSIS — I82811 Embolism and thrombosis of superficial veins of right lower extremities: Secondary | ICD-10-CM | POA: Diagnosis not present

## 2020-09-29 ENCOUNTER — Other Ambulatory Visit: Payer: Self-pay

## 2020-09-29 DIAGNOSIS — I8390 Asymptomatic varicose veins of unspecified lower extremity: Secondary | ICD-10-CM

## 2020-10-19 ENCOUNTER — Other Ambulatory Visit: Payer: Self-pay

## 2020-10-19 ENCOUNTER — Ambulatory Visit (INDEPENDENT_AMBULATORY_CARE_PROVIDER_SITE_OTHER): Payer: BC Managed Care – PPO | Admitting: Physician Assistant

## 2020-10-19 ENCOUNTER — Ambulatory Visit (HOSPITAL_COMMUNITY)
Admission: RE | Admit: 2020-10-19 | Discharge: 2020-10-19 | Disposition: A | Discharge status: Home / Self Care | Payer: BC Managed Care – PPO | Source: Ambulatory Visit | Attending: Physician Assistant | Admitting: Physician Assistant

## 2020-10-19 VITALS — BP 138/91 | HR 61 | Temp 98.1°F | Resp 20 | Ht 70.0 in | Wt 274.3 lb

## 2020-10-19 DIAGNOSIS — I8390 Asymptomatic varicose veins of unspecified lower extremity: Secondary | ICD-10-CM | POA: Insufficient documentation

## 2020-10-19 DIAGNOSIS — I872 Venous insufficiency (chronic) (peripheral): Secondary | ICD-10-CM | POA: Diagnosis not present

## 2020-10-19 NOTE — Progress Notes (Signed)
Requested by:  Deatra James, [place] [id number] [address] [place],  [place] [address]  Reason for consultation: bilateral varicose veins   History of Present Illness   Tina [name] is a 39 y.o. ([birth year]) female who presents for evaluation of varicose vein right lower extremity and bilateral lower extremity edema. Patient states that for as long as she can remember she has had pitting edema in both her ankles. It does not change with compression or elevation. She also for several years started noticing some broken veins in her lower legs and then last year in March of 2020 she developed a "knot" in the vein overlying her right knee. She said it was tender and stayed hard for a while but eventually went away. At the time she had a telehealth visit and was told it was superficial phlebitis. She had a recurrence of this phlebitis recently several months ago and it just within the last week resolved. She otherwise does not have any pain in her legs, aching, burning, heaviness or tiredness. She does get intermittent tingling in her feet at night time. She has been wearing some OTC knee high compression stockings which does maker her legs feel better. She does not elevate aside from when she is sleeping. She has no family history or personal history of DVT. She has no family history of venous disease.   Venous symptoms include: swelling, phlebitis Onset/duration: > 1 year Occupation: Estate manager/land agent at [employer] Aggravating factors: sitting, standing Alleviating factors: compression Compression:  OTC knee high Helps:  yes Pain medications: no Previous vein procedures: none History of DVT:  No  Past Medical History:  Diagnosis Date  . Anxiety   . Chest pain   . Palpitations   . Pilonidal cyst   . Weight gain     Social History   Socioeconomic History  . Marital status: Single    Spouse name: Not on file  . Number of children: Not on file  . Years of  education: Not on file  . Highest education level: Not on file  Occupational History  . Not on file  Tobacco Use  . Smoking status: Current Every Day Smoker  . Smokeless tobacco: Never Used  Vaping Use  . Vaping Use: Never used  Substance and Sexual Activity  . Alcohol use: Yes  . Drug use: Yes  . Sexual activity: Yes  Other Topics Concern  . Not on file  Social History Narrative  . Not on file   Social Determinants of Health   Financial Resource Strain:   . Difficulty of Paying Living Expenses: Not on file  Food Insecurity:   . Worried About Programme researcher, broadcasting/film/video in the Last Year: Not on file  . Ran Out of Food in the Last Year: Not on file  Transportation Needs:   . Lack of Transportation (Medical): Not on file  . Lack of Transportation (Non-Medical): Not on file  Physical Activity:   . Days of Exercise per Week: Not on file  . Minutes of Exercise per Session: Not on file  Stress:   . Feeling of Stress : Not on file  Social Connections:   . Frequency of Communication with Friends and Family: Not on file  . Frequency of Social Gatherings with Friends and Family: Not on file  . Attends Religious Services: Not on file  . Active Member of Clubs or Organizations: Not on file  . Attends Banker Meetings: Not on  file  . Marital Status: Not on file  Intimate Partner Violence:   . Fear of Current or Ex-Partner: Not on file  . Emotionally Abused: Not on file  . Physically Abused: Not on file  . Sexually Abused: Not on file    Family History  Problem Relation Age of Onset  . Hypertension Mother   . Cholesteatoma Father   . Hyperlipidemia Father   . Heart attack Father 68  . Hypertension Father   . Cancer - Lung Maternal Grandmother   . Heart attack Maternal Grandfather   . Hypertension Paternal Grandmother   . CVA Paternal Grandmother     Current Outpatient Medications  Medication Sig Dispense Refill  . fluvoxaMINE (LUVOX) 100 MG tablet Take by mouth.     Marland Kitchen LORazepam (ATIVAN) 1 MG tablet Take 1 tablet by mouth daily as needed for anxiety.      No current facility-administered medications for this visit.    No Known Allergies  REVIEW OF SYSTEMS (negative unless checked):   Cardiac:  []  Chest pain or chest pressure? []  Shortness of breath upon activity? []  Shortness of breath when lying flat? []  Irregular heart rhythm?  Vascular:  []  Pain in calf, thigh, or hip brought on by walking? []  Pain in feet at night that wakes you up from your sleep? []  Blood clot in your veins? []  Leg swelling?  Pulmonary:  []  Oxygen at home? []  Productive cough? []  Wheezing?  Neurologic:  []  Sudden weakness in arms or legs? []  Sudden numbness in arms or legs? []  Sudden onset of difficult speaking or slurred speech? []  Temporary loss of vision in one eye? []  Problems with dizziness?  Gastrointestinal:  []  Blood in stool? []  Vomited blood?  Genitourinary:  []  Burning when urinating? []  Blood in urine?  Psychiatric:  []  Major depression  Hematologic:  []  Bleeding problems? []  Problems with blood clotting?  Dermatologic:  []  Rashes or ulcers?  Constitutional:  []  Fever or chills?  Ear/Nose/Throat:  []  Change in hearing? []  Nose bleeds? []  Sore throat?  Musculoskeletal:  []  Back pain? []  Joint pain? []  Muscle pain?   Physical Examination     Vitals:   10/19/20 1412  BP: (!) 138/91  Pulse: 61  Resp: 20  Temp: 98.1 F (36.7 C)  SpO2: 98%  Weight: 274 lb 4.8 oz (124.4 kg)  Height: 5\' 10"  (1.778 m)   Body mass index is 39.36 kg/m.  General:  WDWN in NAD; vital signs documented above Gait: Normal HENT: WNL, normocephalic Pulmonary: normal non-labored breathing , without wheezing Cardiac: regular HR Vascular Exam/Pulses: Femoral 2+ (normal) 2+ (normal)  Popliteal 2+ (normal) 2+ (normal)  DP 2+ (normal) 2+ (normal)  PT 2+ (normal) 2+ (normal)   Extremities: with varicose veins of right anterior thigh, with  reticular veins of bilateral lower extremities, with edema bilateral ankles, without stasis pigmentation, without lipodermatosclerosis, without ulcers Musculoskeletal: no muscle wasting or atrophy  Neurologic: A&O X 3;  No focal weakness or paresthesias are detected Psychiatric:  The pt has Normal affect.  Non-invasive Vascular Imaging   BLE Venous Insufficiency Duplex (10/19/20):   RLE:   No DVT and SVT  No GSV reflux   GSV diameter 0.32-0.72  No SSV reflux  CFV deep venous reflux  AASV with reflux, diameter of 0.40   LLE:  No DVT and SVT  GSV reflux distal thigh, proximal and SFJ  GSV diameter 0.39-0.86  No SSV reflux   No deep venous reflux  Medical Decision Making   Tina [name] is a 39 y.o. female who presents with: BLE chronic venous insufficiency with varicose veins with some recurrent superficial thrombophlebitis. No current phlebitis. She otherwise has minimal lower extremity symptoms relatable to her venous disease.  Duplex today shows no DVT but she does have deep reflux on the right lower extremity and bilateral superficial reflux. She would be a candidate for ablation but in discussing this with her she is not interested at this time. She really just wanted reassurance that she did not have any serious issues with her veins. Provided reassurance that she has adequate arterial flow and no DVT.  Based on the patient's history and examination, I recommend conservative therapy with exercise, weight reduction, elevation and compression  I discussed with the patient the use of her 20-30 mm thigh high compression stockings and need for 3 month trial of such  She is not interested in any invasive procedures and is content with following up as needed   Graceann Congress, PA-C [facility] Office: [phone]  10/19/2020, 2:54 PM  Clinic MD: Dr. Darrick Penna

## 2021-03-04 DIAGNOSIS — F422 Mixed obsessional thoughts and acts: Secondary | ICD-10-CM | POA: Diagnosis not present

## 2021-03-30 DIAGNOSIS — Z0000 Encounter for general adult medical examination without abnormal findings: Secondary | ICD-10-CM | POA: Diagnosis not present

## 2021-03-30 DIAGNOSIS — D229 Melanocytic nevi, unspecified: Secondary | ICD-10-CM | POA: Diagnosis not present

## 2021-03-30 DIAGNOSIS — I10 Essential (primary) hypertension: Secondary | ICD-10-CM | POA: Diagnosis not present

## 2021-03-30 DIAGNOSIS — Z13220 Encounter for screening for lipoid disorders: Secondary | ICD-10-CM | POA: Diagnosis not present

## 2021-04-18 ENCOUNTER — Other Ambulatory Visit: Payer: Self-pay | Admitting: Family Medicine

## 2021-04-18 DIAGNOSIS — Z1231 Encounter for screening mammogram for malignant neoplasm of breast: Secondary | ICD-10-CM

## 2021-04-21 DIAGNOSIS — N83209 Unspecified ovarian cyst, unspecified side: Secondary | ICD-10-CM | POA: Diagnosis not present

## 2021-05-02 ENCOUNTER — Other Ambulatory Visit: Payer: Self-pay | Admitting: Family Medicine

## 2021-05-02 DIAGNOSIS — I10 Essential (primary) hypertension: Secondary | ICD-10-CM | POA: Diagnosis not present

## 2021-05-02 DIAGNOSIS — E7800 Pure hypercholesterolemia, unspecified: Secondary | ICD-10-CM | POA: Diagnosis not present

## 2021-05-02 DIAGNOSIS — Z8249 Family history of ischemic heart disease and other diseases of the circulatory system: Secondary | ICD-10-CM

## 2021-06-01 ENCOUNTER — Ambulatory Visit
Admission: RE | Admit: 2021-06-01 | Discharge: 2021-06-01 | Disposition: A | Discharge status: Home / Self Care | Payer: No Typology Code available for payment source | Source: Ambulatory Visit | Attending: Family Medicine | Admitting: Family Medicine

## 2021-06-01 DIAGNOSIS — E7800 Pure hypercholesterolemia, unspecified: Secondary | ICD-10-CM

## 2021-06-01 DIAGNOSIS — Z8249 Family history of ischemic heart disease and other diseases of the circulatory system: Secondary | ICD-10-CM

## 2021-06-01 DIAGNOSIS — I10 Essential (primary) hypertension: Secondary | ICD-10-CM

## 2021-07-24 DIAGNOSIS — D2272 Melanocytic nevi of left lower limb, including hip: Secondary | ICD-10-CM | POA: Diagnosis not present

## 2021-07-24 DIAGNOSIS — D225 Melanocytic nevi of trunk: Secondary | ICD-10-CM | POA: Diagnosis not present

## 2021-07-24 DIAGNOSIS — D2262 Melanocytic nevi of left upper limb, including shoulder: Secondary | ICD-10-CM | POA: Diagnosis not present

## 2021-07-24 DIAGNOSIS — D2261 Melanocytic nevi of right upper limb, including shoulder: Secondary | ICD-10-CM | POA: Diagnosis not present

## 2021-07-24 DIAGNOSIS — B078 Other viral warts: Secondary | ICD-10-CM | POA: Diagnosis not present

## 2021-07-26 DIAGNOSIS — Z01419 Encounter for gynecological examination (general) (routine) without abnormal findings: Secondary | ICD-10-CM | POA: Diagnosis not present

## 2021-08-03 ENCOUNTER — Ambulatory Visit: Payer: BC Managed Care – PPO

## 2021-08-24 DIAGNOSIS — F422 Mixed obsessional thoughts and acts: Secondary | ICD-10-CM | POA: Diagnosis not present

## 2021-09-01 DIAGNOSIS — E7800 Pure hypercholesterolemia, unspecified: Secondary | ICD-10-CM | POA: Diagnosis not present

## 2021-09-15 DIAGNOSIS — I10 Essential (primary) hypertension: Secondary | ICD-10-CM | POA: Diagnosis not present

## 2021-09-15 DIAGNOSIS — E7800 Pure hypercholesterolemia, unspecified: Secondary | ICD-10-CM | POA: Diagnosis not present

## 2021-09-19 ENCOUNTER — Other Ambulatory Visit: Payer: Self-pay

## 2021-09-19 ENCOUNTER — Ambulatory Visit
Admission: RE | Admit: 2021-09-19 | Discharge: 2021-09-19 | Disposition: A | Discharge status: Home / Self Care | Payer: BC Managed Care – PPO | Source: Ambulatory Visit | Attending: Family Medicine | Admitting: Family Medicine

## 2021-09-19 DIAGNOSIS — Z1231 Encounter for screening mammogram for malignant neoplasm of breast: Secondary | ICD-10-CM | POA: Diagnosis not present

## 2022-02-16 DIAGNOSIS — F422 Mixed obsessional thoughts and acts: Secondary | ICD-10-CM | POA: Diagnosis not present

## 2022-04-10 DIAGNOSIS — M5412 Radiculopathy, cervical region: Secondary | ICD-10-CM | POA: Diagnosis not present

## 2022-04-24 DIAGNOSIS — E559 Vitamin D deficiency, unspecified: Secondary | ICD-10-CM | POA: Diagnosis not present

## 2022-04-24 DIAGNOSIS — E785 Hyperlipidemia, unspecified: Secondary | ICD-10-CM | POA: Diagnosis not present

## 2022-04-24 DIAGNOSIS — N920 Excessive and frequent menstruation with regular cycle: Secondary | ICD-10-CM | POA: Diagnosis not present

## 2022-04-24 DIAGNOSIS — I10 Essential (primary) hypertension: Secondary | ICD-10-CM | POA: Diagnosis not present

## 2022-04-24 DIAGNOSIS — M5412 Radiculopathy, cervical region: Secondary | ICD-10-CM | POA: Diagnosis not present

## 2022-04-24 DIAGNOSIS — Z0000 Encounter for general adult medical examination without abnormal findings: Secondary | ICD-10-CM | POA: Diagnosis not present

## 2022-04-25 ENCOUNTER — Other Ambulatory Visit: Payer: Self-pay | Admitting: Family Medicine

## 2022-04-25 DIAGNOSIS — Z1231 Encounter for screening mammogram for malignant neoplasm of breast: Secondary | ICD-10-CM

## 2022-05-03 DIAGNOSIS — R293 Abnormal posture: Secondary | ICD-10-CM | POA: Diagnosis not present

## 2022-05-03 DIAGNOSIS — M25512 Pain in left shoulder: Secondary | ICD-10-CM | POA: Diagnosis not present

## 2022-05-03 DIAGNOSIS — M546 Pain in thoracic spine: Secondary | ICD-10-CM | POA: Diagnosis not present

## 2022-05-17 DIAGNOSIS — M546 Pain in thoracic spine: Secondary | ICD-10-CM | POA: Diagnosis not present

## 2022-05-17 DIAGNOSIS — M25512 Pain in left shoulder: Secondary | ICD-10-CM | POA: Diagnosis not present

## 2022-05-17 DIAGNOSIS — R293 Abnormal posture: Secondary | ICD-10-CM | POA: Diagnosis not present

## 2022-06-06 DIAGNOSIS — M542 Cervicalgia: Secondary | ICD-10-CM | POA: Diagnosis not present

## 2022-06-16 DIAGNOSIS — M542 Cervicalgia: Secondary | ICD-10-CM | POA: Diagnosis not present

## 2022-06-26 DIAGNOSIS — M542 Cervicalgia: Secondary | ICD-10-CM | POA: Diagnosis not present

## 2022-07-16 DIAGNOSIS — U071 COVID-19: Secondary | ICD-10-CM | POA: Diagnosis not present

## 2022-09-12 DIAGNOSIS — F422 Mixed obsessional thoughts and acts: Secondary | ICD-10-CM | POA: Diagnosis not present

## 2022-09-20 ENCOUNTER — Ambulatory Visit: Payer: BC Managed Care – PPO

## 2022-10-02 ENCOUNTER — Ambulatory Visit
Admission: RE | Admit: 2022-10-02 | Discharge: 2022-10-02 | Disposition: A | Discharge status: Home / Self Care | Payer: BC Managed Care – PPO | Source: Ambulatory Visit | Attending: Family Medicine | Admitting: Family Medicine

## 2022-10-02 DIAGNOSIS — Z1231 Encounter for screening mammogram for malignant neoplasm of breast: Secondary | ICD-10-CM

## 2022-11-10 DIAGNOSIS — J019 Acute sinusitis, unspecified: Secondary | ICD-10-CM | POA: Diagnosis not present

## 2022-11-10 DIAGNOSIS — Z20822 Contact with and (suspected) exposure to covid-19: Secondary | ICD-10-CM | POA: Diagnosis not present

## 2022-11-10 DIAGNOSIS — R059 Cough, unspecified: Secondary | ICD-10-CM | POA: Diagnosis not present

## 2023-03-06 DIAGNOSIS — F422 Mixed obsessional thoughts and acts: Secondary | ICD-10-CM | POA: Diagnosis not present

## 2023-05-02 DIAGNOSIS — Z13220 Encounter for screening for lipoid disorders: Secondary | ICD-10-CM | POA: Diagnosis not present

## 2023-05-02 DIAGNOSIS — Z1159 Encounter for screening for other viral diseases: Secondary | ICD-10-CM | POA: Diagnosis not present

## 2023-05-02 DIAGNOSIS — Z113 Encounter for screening for infections with a predominantly sexual mode of transmission: Secondary | ICD-10-CM | POA: Diagnosis not present

## 2023-05-02 DIAGNOSIS — Z0184 Encounter for antibody response examination: Secondary | ICD-10-CM | POA: Diagnosis not present

## 2023-05-02 DIAGNOSIS — F17210 Nicotine dependence, cigarettes, uncomplicated: Secondary | ICD-10-CM | POA: Diagnosis not present

## 2023-05-02 DIAGNOSIS — M898X9 Other specified disorders of bone, unspecified site: Secondary | ICD-10-CM | POA: Diagnosis not present

## 2023-05-02 DIAGNOSIS — E559 Vitamin D deficiency, unspecified: Secondary | ICD-10-CM | POA: Diagnosis not present

## 2023-05-02 DIAGNOSIS — Z0000 Encounter for general adult medical examination without abnormal findings: Secondary | ICD-10-CM | POA: Diagnosis not present

## 2023-05-02 DIAGNOSIS — I10 Essential (primary) hypertension: Secondary | ICD-10-CM | POA: Diagnosis not present

## 2023-06-03 DIAGNOSIS — Z206 Contact with and (suspected) exposure to human immunodeficiency virus [HIV]: Secondary | ICD-10-CM | POA: Diagnosis not present

## 2023-08-20 DIAGNOSIS — D485 Neoplasm of uncertain behavior of skin: Secondary | ICD-10-CM | POA: Diagnosis not present

## 2023-08-20 DIAGNOSIS — B079 Viral wart, unspecified: Secondary | ICD-10-CM | POA: Diagnosis not present

## 2023-08-28 DIAGNOSIS — F900 Attention-deficit hyperactivity disorder, predominantly inattentive type: Secondary | ICD-10-CM | POA: Diagnosis not present

## 2023-08-28 DIAGNOSIS — F4322 Adjustment disorder with anxiety: Secondary | ICD-10-CM | POA: Diagnosis not present

## 2023-08-28 DIAGNOSIS — F422 Mixed obsessional thoughts and acts: Secondary | ICD-10-CM | POA: Diagnosis not present

## 2023-08-30 ENCOUNTER — Institutional Professional Consult (permissible substitution): Payer: BC Managed Care – PPO | Admitting: Plastic Surgery

## 2023-09-17 IMAGING — MG MM DIGITAL SCREENING BILAT W/ TOMO AND CAD
8 series · 8 of 24 positions shown · non-contrast
Comparison: None.

CLINICAL DATA: Screening.

EXAM:
DIGITAL SCREENING BILATERAL MAMMOGRAM WITH TOMOSYNTHESIS AND CAD
TECHNIQUE: Bilateral screening digital craniocaudal and mediolateral oblique
mammograms were obtained. Bilateral screening digital breast
tomosynthesis was performed. The images were evaluated with
computer-aided detection.

[R CC synth-2D]
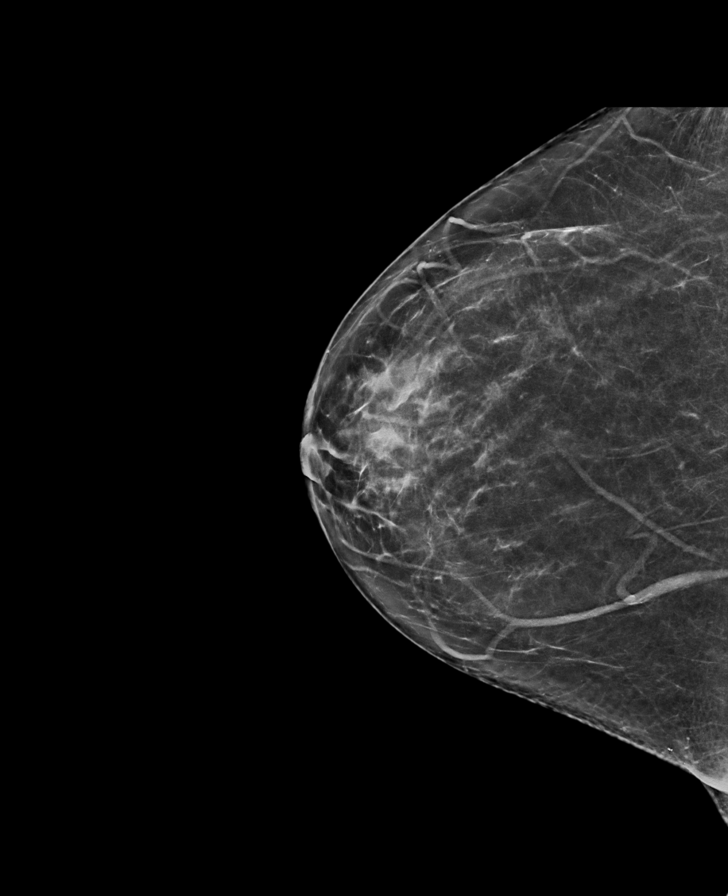

[L CC synth-2D]
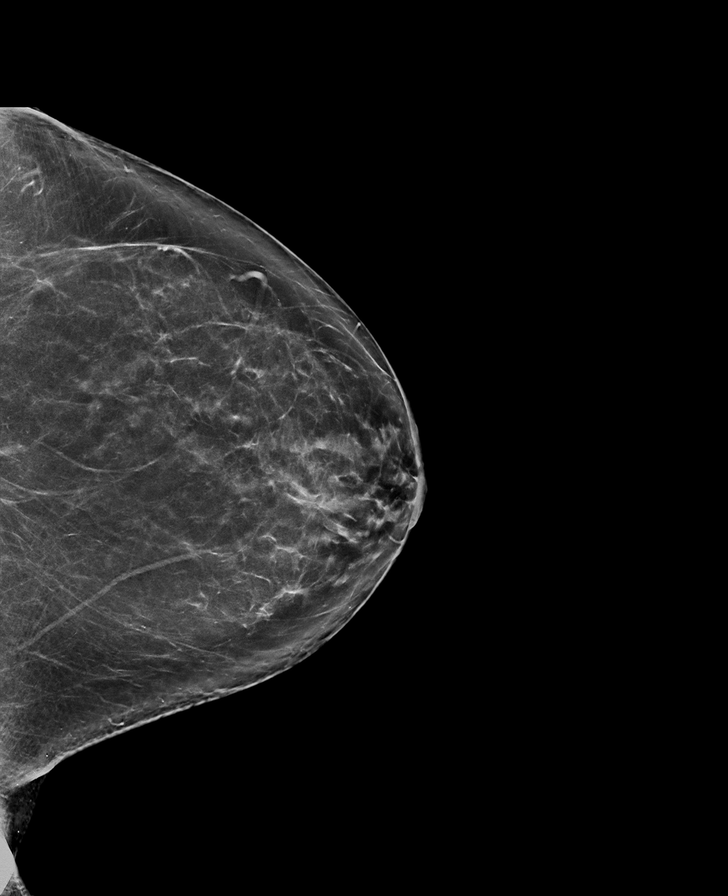

[L MLO synth-2D]
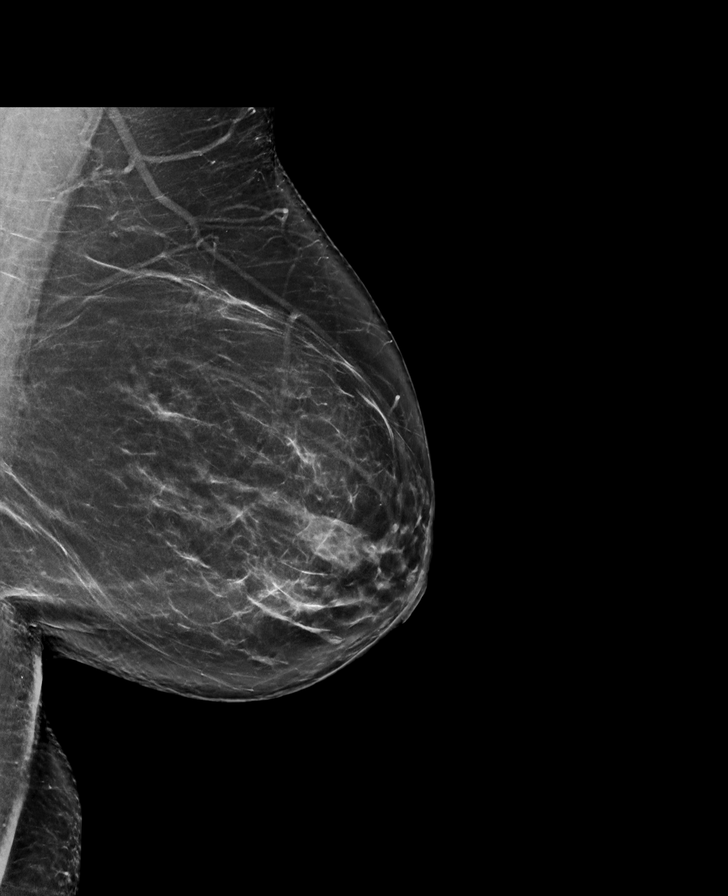

[R MLO synth-2D]
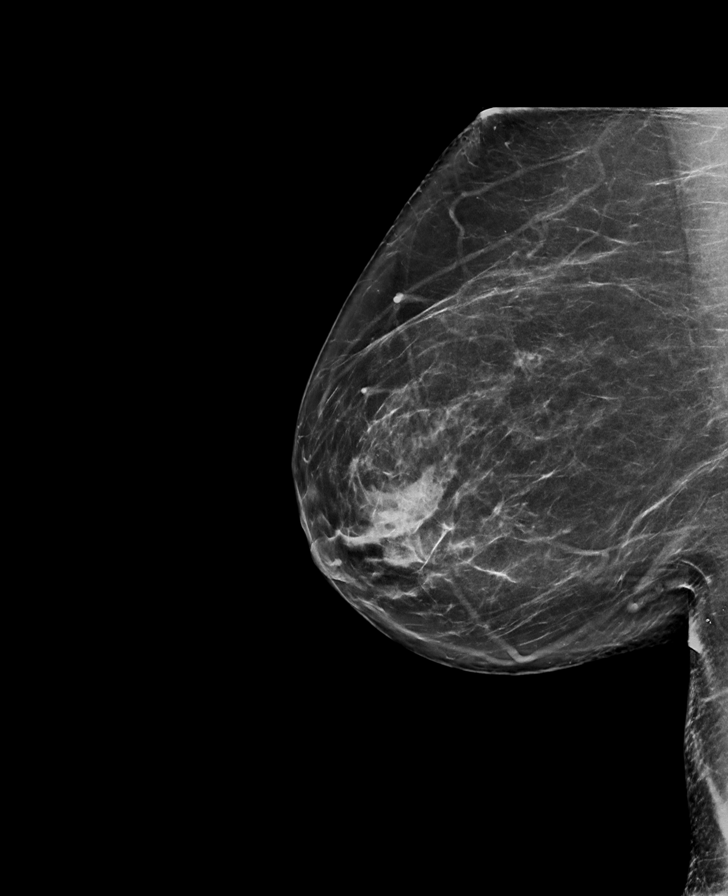

[R MLO tomo · tomo slice 41/82.0]
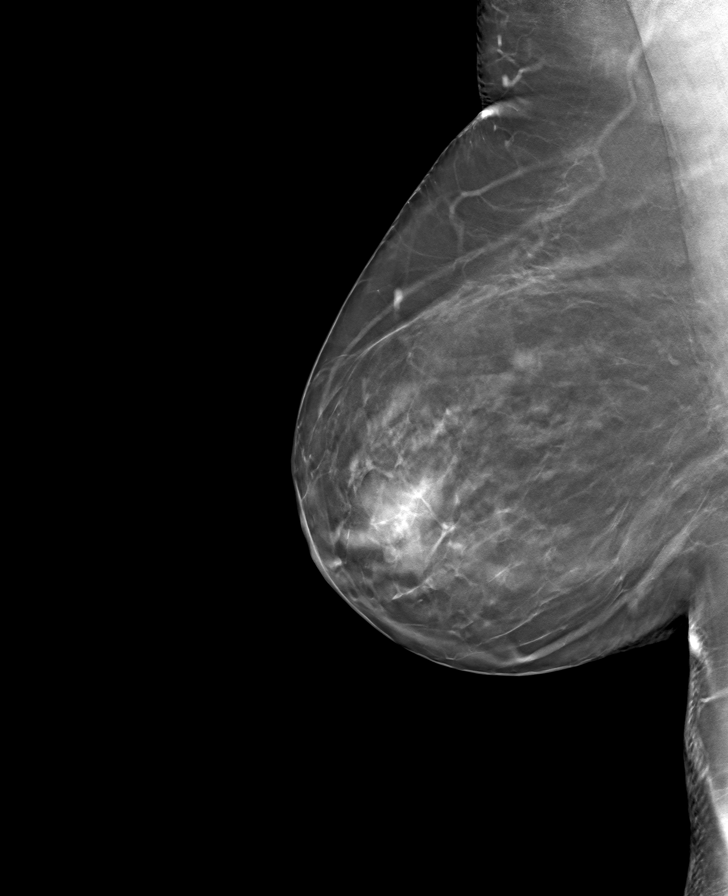

[R CC tomo · tomo slice 37/73.0]
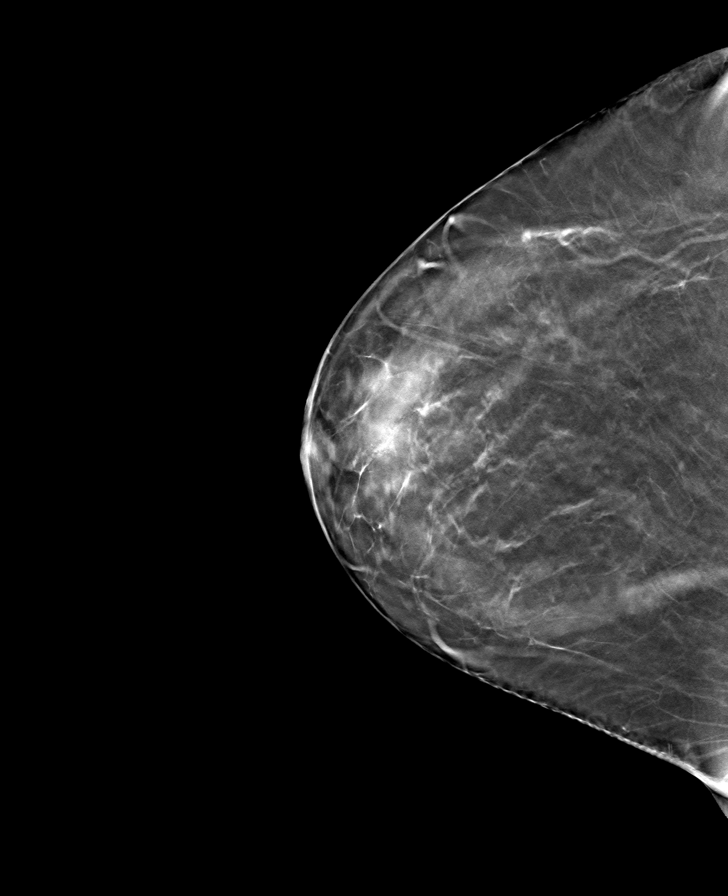

[L MLO tomo · tomo slice 44/87.0]
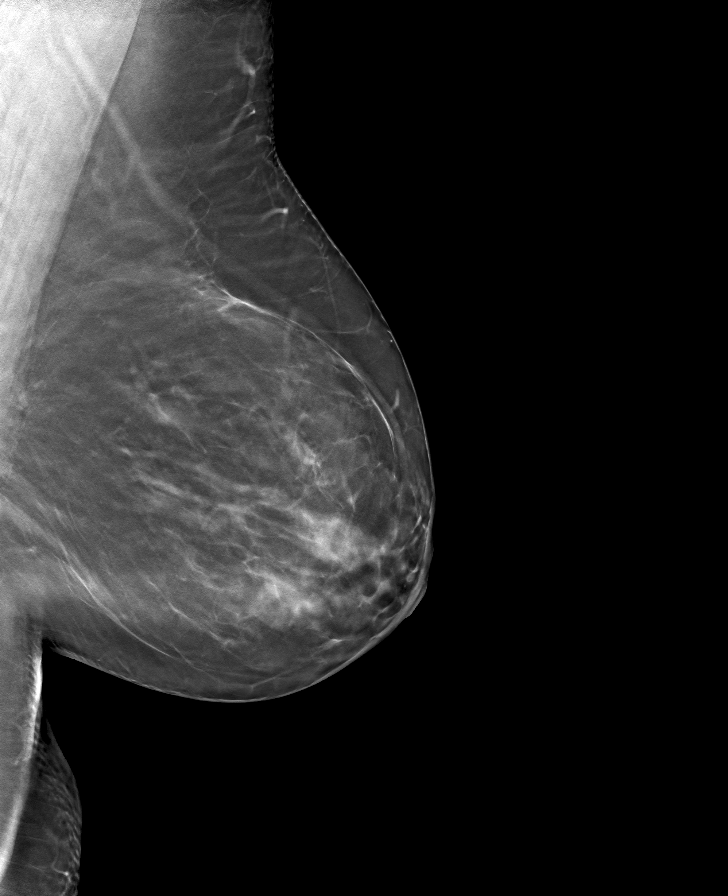

[L CC tomo · tomo slice 42/83.0]
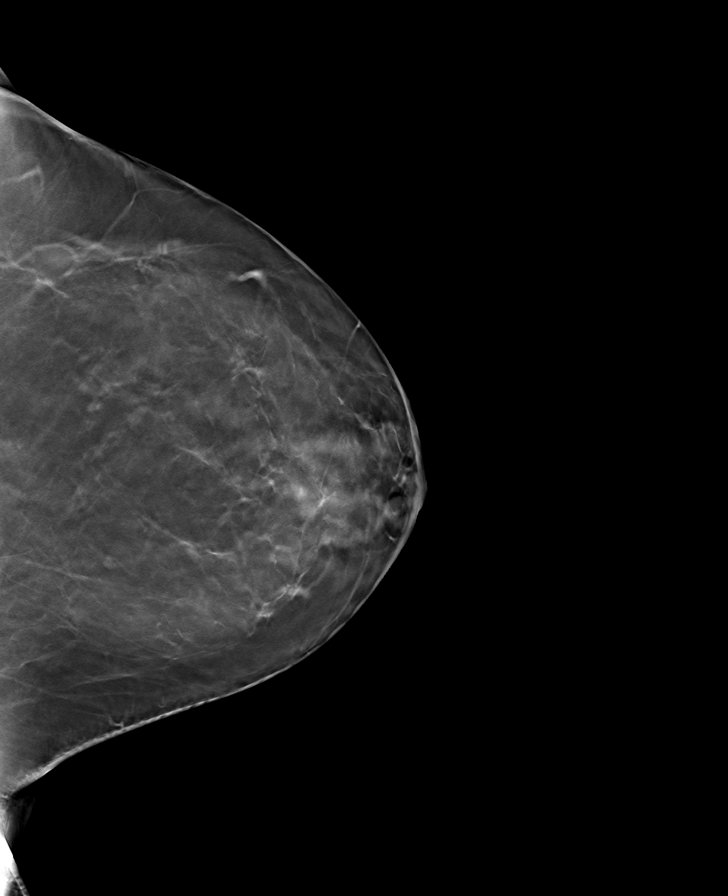

[8 of 24 positions shown; findings below may reference images not displayed]

ACR Breast Density Category c: The breast tissue is heterogeneously
dense, which may obscure small masses
FINDINGS: There are no findings suspicious for malignancy.
IMPRESSION: No mammographic evidence of malignancy. A result letter of this
screening mammogram will be mailed directly to the patient.

RECOMMENDATION:
Screening mammogram in one year. (Code:C8-T-HNK)

BI-RADS CATEGORY  1: Negative.

## 2023-09-30 ENCOUNTER — Other Ambulatory Visit: Payer: Self-pay | Admitting: Family Medicine

## 2023-09-30 DIAGNOSIS — Z1231 Encounter for screening mammogram for malignant neoplasm of breast: Secondary | ICD-10-CM

## 2023-10-03 DIAGNOSIS — Z113 Encounter for screening for infections with a predominantly sexual mode of transmission: Secondary | ICD-10-CM | POA: Diagnosis not present

## 2023-10-03 DIAGNOSIS — F17200 Nicotine dependence, unspecified, uncomplicated: Secondary | ICD-10-CM | POA: Diagnosis not present

## 2023-10-03 DIAGNOSIS — I10 Essential (primary) hypertension: Secondary | ICD-10-CM | POA: Diagnosis not present

## 2023-10-03 DIAGNOSIS — Z206 Contact with and (suspected) exposure to human immunodeficiency virus [HIV]: Secondary | ICD-10-CM | POA: Diagnosis not present

## 2023-10-03 DIAGNOSIS — Z124 Encounter for screening for malignant neoplasm of cervix: Secondary | ICD-10-CM | POA: Diagnosis not present

## 2023-10-03 DIAGNOSIS — Z01419 Encounter for gynecological examination (general) (routine) without abnormal findings: Secondary | ICD-10-CM | POA: Diagnosis not present

## 2023-10-22 ENCOUNTER — Ambulatory Visit
Admission: RE | Admit: 2023-10-22 | Discharge: 2023-10-22 | Disposition: A | Discharge status: Home / Self Care | Payer: BC Managed Care – PPO | Source: Ambulatory Visit | Attending: Family Medicine | Admitting: Family Medicine

## 2023-10-22 DIAGNOSIS — Z1231 Encounter for screening mammogram for malignant neoplasm of breast: Secondary | ICD-10-CM | POA: Diagnosis not present

## 2023-11-27 DIAGNOSIS — F900 Attention-deficit hyperactivity disorder, predominantly inattentive type: Secondary | ICD-10-CM | POA: Diagnosis not present

## 2023-11-27 DIAGNOSIS — F4322 Adjustment disorder with anxiety: Secondary | ICD-10-CM | POA: Diagnosis not present

## 2023-11-27 DIAGNOSIS — F422 Mixed obsessional thoughts and acts: Secondary | ICD-10-CM | POA: Diagnosis not present

## 2023-12-06 ENCOUNTER — Institutional Professional Consult (permissible substitution): Payer: BC Managed Care – PPO | Admitting: Plastic Surgery

## 2024-01-24 DIAGNOSIS — Z206 Contact with and (suspected) exposure to human immunodeficiency virus [HIV]: Secondary | ICD-10-CM | POA: Diagnosis not present

## 2024-04-02 DIAGNOSIS — Z630 Problems in relationship with spouse or partner: Secondary | ICD-10-CM | POA: Diagnosis not present

## 2024-04-02 DIAGNOSIS — F419 Anxiety disorder, unspecified: Secondary | ICD-10-CM | POA: Diagnosis not present

## 2024-04-16 DIAGNOSIS — F419 Anxiety disorder, unspecified: Secondary | ICD-10-CM | POA: Diagnosis not present

## 2024-04-16 DIAGNOSIS — Z630 Problems in relationship with spouse or partner: Secondary | ICD-10-CM | POA: Diagnosis not present

## 2024-04-24 DIAGNOSIS — Z630 Problems in relationship with spouse or partner: Secondary | ICD-10-CM | POA: Diagnosis not present

## 2024-04-24 DIAGNOSIS — F419 Anxiety disorder, unspecified: Secondary | ICD-10-CM | POA: Diagnosis not present

## 2024-05-07 DIAGNOSIS — Z630 Problems in relationship with spouse or partner: Secondary | ICD-10-CM | POA: Diagnosis not present

## 2024-05-07 DIAGNOSIS — F419 Anxiety disorder, unspecified: Secondary | ICD-10-CM | POA: Diagnosis not present

## 2024-05-22 DIAGNOSIS — F419 Anxiety disorder, unspecified: Secondary | ICD-10-CM | POA: Diagnosis not present

## 2024-05-22 DIAGNOSIS — Z630 Problems in relationship with spouse or partner: Secondary | ICD-10-CM | POA: Diagnosis not present

## 2024-05-28 DIAGNOSIS — Z13220 Encounter for screening for lipoid disorders: Secondary | ICD-10-CM | POA: Diagnosis not present

## 2024-05-28 DIAGNOSIS — I10 Essential (primary) hypertension: Secondary | ICD-10-CM | POA: Diagnosis not present

## 2024-05-28 DIAGNOSIS — Z23 Encounter for immunization: Secondary | ICD-10-CM | POA: Diagnosis not present

## 2024-05-28 DIAGNOSIS — Z0000 Encounter for general adult medical examination without abnormal findings: Secondary | ICD-10-CM | POA: Diagnosis not present

## 2024-05-28 DIAGNOSIS — E569 Vitamin deficiency, unspecified: Secondary | ICD-10-CM | POA: Diagnosis not present

## 2024-05-28 DIAGNOSIS — E559 Vitamin D deficiency, unspecified: Secondary | ICD-10-CM | POA: Diagnosis not present

## 2024-05-29 DIAGNOSIS — F422 Mixed obsessional thoughts and acts: Secondary | ICD-10-CM | POA: Diagnosis not present

## 2024-05-29 DIAGNOSIS — F4322 Adjustment disorder with anxiety: Secondary | ICD-10-CM | POA: Diagnosis not present

## 2024-05-29 DIAGNOSIS — F900 Attention-deficit hyperactivity disorder, predominantly inattentive type: Secondary | ICD-10-CM | POA: Diagnosis not present

## 2024-06-05 DIAGNOSIS — F419 Anxiety disorder, unspecified: Secondary | ICD-10-CM | POA: Diagnosis not present

## 2024-06-05 DIAGNOSIS — Z630 Problems in relationship with spouse or partner: Secondary | ICD-10-CM | POA: Diagnosis not present

## 2024-06-12 DIAGNOSIS — F419 Anxiety disorder, unspecified: Secondary | ICD-10-CM | POA: Diagnosis not present

## 2024-06-12 DIAGNOSIS — Z630 Problems in relationship with spouse or partner: Secondary | ICD-10-CM | POA: Diagnosis not present

## 2024-06-25 DIAGNOSIS — Z630 Problems in relationship with spouse or partner: Secondary | ICD-10-CM | POA: Diagnosis not present

## 2024-06-25 DIAGNOSIS — F419 Anxiety disorder, unspecified: Secondary | ICD-10-CM | POA: Diagnosis not present

## 2024-06-30 DIAGNOSIS — Z630 Problems in relationship with spouse or partner: Secondary | ICD-10-CM | POA: Diagnosis not present

## 2024-06-30 DIAGNOSIS — F419 Anxiety disorder, unspecified: Secondary | ICD-10-CM | POA: Diagnosis not present

## 2024-07-06 DIAGNOSIS — F419 Anxiety disorder, unspecified: Secondary | ICD-10-CM | POA: Diagnosis not present

## 2024-07-06 DIAGNOSIS — Z630 Problems in relationship with spouse or partner: Secondary | ICD-10-CM | POA: Diagnosis not present

## 2024-07-10 DIAGNOSIS — F331 Major depressive disorder, recurrent, moderate: Secondary | ICD-10-CM | POA: Diagnosis not present

## 2024-07-10 DIAGNOSIS — F50811 Binge eating disorder, moderate: Secondary | ICD-10-CM | POA: Diagnosis not present

## 2024-07-10 DIAGNOSIS — F410 Panic disorder [episodic paroxysmal anxiety] without agoraphobia: Secondary | ICD-10-CM | POA: Diagnosis not present

## 2024-07-10 DIAGNOSIS — F4312 Post-traumatic stress disorder, chronic: Secondary | ICD-10-CM | POA: Diagnosis not present

## 2024-07-10 DIAGNOSIS — F605 Obsessive-compulsive personality disorder: Secondary | ICD-10-CM | POA: Diagnosis not present

## 2024-07-10 DIAGNOSIS — F411 Generalized anxiety disorder: Secondary | ICD-10-CM | POA: Diagnosis not present

## 2024-07-28 DIAGNOSIS — Z630 Problems in relationship with spouse or partner: Secondary | ICD-10-CM | POA: Diagnosis not present

## 2024-07-28 DIAGNOSIS — F419 Anxiety disorder, unspecified: Secondary | ICD-10-CM | POA: Diagnosis not present

## 2024-08-19 DIAGNOSIS — F411 Generalized anxiety disorder: Secondary | ICD-10-CM | POA: Diagnosis not present

## 2024-08-19 DIAGNOSIS — F331 Major depressive disorder, recurrent, moderate: Secondary | ICD-10-CM | POA: Diagnosis not present

## 2024-08-19 DIAGNOSIS — F4312 Post-traumatic stress disorder, chronic: Secondary | ICD-10-CM | POA: Diagnosis not present

## 2024-08-19 DIAGNOSIS — F605 Obsessive-compulsive personality disorder: Secondary | ICD-10-CM | POA: Diagnosis not present

## 2024-08-28 DIAGNOSIS — Z23 Encounter for immunization: Secondary | ICD-10-CM | POA: Diagnosis not present

## 2024-09-03 DIAGNOSIS — F4310 Post-traumatic stress disorder, unspecified: Secondary | ICD-10-CM | POA: Diagnosis not present

## 2024-09-18 DIAGNOSIS — F411 Generalized anxiety disorder: Secondary | ICD-10-CM | POA: Diagnosis not present

## 2024-09-18 DIAGNOSIS — F4312 Post-traumatic stress disorder, chronic: Secondary | ICD-10-CM | POA: Diagnosis not present

## 2024-09-18 DIAGNOSIS — F331 Major depressive disorder, recurrent, moderate: Secondary | ICD-10-CM | POA: Diagnosis not present

## 2024-09-18 DIAGNOSIS — F605 Obsessive-compulsive personality disorder: Secondary | ICD-10-CM | POA: Diagnosis not present

## 2024-09-21 ENCOUNTER — Other Ambulatory Visit: Payer: Self-pay | Admitting: Family Medicine

## 2024-09-21 DIAGNOSIS — Z0000 Encounter for general adult medical examination without abnormal findings: Secondary | ICD-10-CM

## 2024-09-22 DIAGNOSIS — F4310 Post-traumatic stress disorder, unspecified: Secondary | ICD-10-CM | POA: Diagnosis not present

## 2024-10-05 DIAGNOSIS — Z01419 Encounter for gynecological examination (general) (routine) without abnormal findings: Secondary | ICD-10-CM | POA: Diagnosis not present

## 2024-10-07 DIAGNOSIS — F4310 Post-traumatic stress disorder, unspecified: Secondary | ICD-10-CM | POA: Diagnosis not present

## 2024-10-14 DIAGNOSIS — N951 Menopausal and female climacteric states: Secondary | ICD-10-CM | POA: Diagnosis not present

## 2024-10-14 DIAGNOSIS — D259 Leiomyoma of uterus, unspecified: Secondary | ICD-10-CM | POA: Diagnosis not present

## 2024-10-14 DIAGNOSIS — N83209 Unspecified ovarian cyst, unspecified side: Secondary | ICD-10-CM | POA: Diagnosis not present

## 2024-10-21 DIAGNOSIS — F4310 Post-traumatic stress disorder, unspecified: Secondary | ICD-10-CM | POA: Diagnosis not present

## 2024-10-22 ENCOUNTER — Ambulatory Visit

## 2024-10-23 ENCOUNTER — Ambulatory Visit
Admission: RE | Admit: 2024-10-23 | Discharge: 2024-10-23 | Disposition: A | Discharge status: Home / Self Care | Source: Ambulatory Visit | Attending: Family Medicine | Admitting: Family Medicine

## 2024-10-23 DIAGNOSIS — Z1231 Encounter for screening mammogram for malignant neoplasm of breast: Secondary | ICD-10-CM | POA: Diagnosis not present

## 2024-10-23 DIAGNOSIS — Z0000 Encounter for general adult medical examination without abnormal findings: Secondary | ICD-10-CM

## 2024-11-01 DIAGNOSIS — J019 Acute sinusitis, unspecified: Secondary | ICD-10-CM | POA: Diagnosis not present

## 2024-11-04 DIAGNOSIS — F4310 Post-traumatic stress disorder, unspecified: Secondary | ICD-10-CM | POA: Diagnosis not present

## 2024-11-09 DIAGNOSIS — N83201 Unspecified ovarian cyst, right side: Secondary | ICD-10-CM | POA: Diagnosis not present

## 2024-11-09 DIAGNOSIS — N83202 Unspecified ovarian cyst, left side: Secondary | ICD-10-CM | POA: Diagnosis not present

## 2024-11-09 DIAGNOSIS — N946 Dysmenorrhea, unspecified: Secondary | ICD-10-CM | POA: Diagnosis not present

## 2024-11-09 DIAGNOSIS — Z1330 Encounter for screening examination for mental health and behavioral disorders, unspecified: Secondary | ICD-10-CM | POA: Diagnosis not present

## 2024-11-09 DIAGNOSIS — N809 Endometriosis, unspecified: Secondary | ICD-10-CM | POA: Diagnosis not present

## 2024-11-17 DIAGNOSIS — F4310 Post-traumatic stress disorder, unspecified: Secondary | ICD-10-CM | POA: Diagnosis not present
